# Patient Record
Sex: Female | Born: 1975 | Race: White | Hispanic: No | Marital: Married
Health system: Southern US, Community
[De-identification: ages and names within clinical notes are randomized; demographics above are authoritative.]

---

## 2005-11-18 ENCOUNTER — Ambulatory Visit: Payer: Self-pay | Admitting: Obstetrics & Gynecology

## 2006-02-10 ENCOUNTER — Inpatient Hospital Stay: Payer: Self-pay

## 2008-09-16 ENCOUNTER — Inpatient Hospital Stay: Payer: Self-pay | Admitting: Orthopedic Surgery

## 2008-09-27 ENCOUNTER — Ambulatory Visit: Payer: Self-pay | Admitting: Podiatry

## 2010-01-09 IMAGING — CR DG CHEST 2V
1 series · 2 of 2 positions shown · non-contrast
Comparison: none

REASON FOR EXAM: trauma
COMMENTS:

PROCEDURE:     DXR - DXR CHEST PA (OR AP) AND LATERAL  - September 16, 2008  [DATE]
RESULT:     The lungs are well-expanded and clear. The heart is normal in
size. The pulmonary vascularity is not engorged. There is no pleural
effusion.

[Series 1: view not recorded · 0.17mm/px · 2 of 2 slices shown]
[im 1/2]
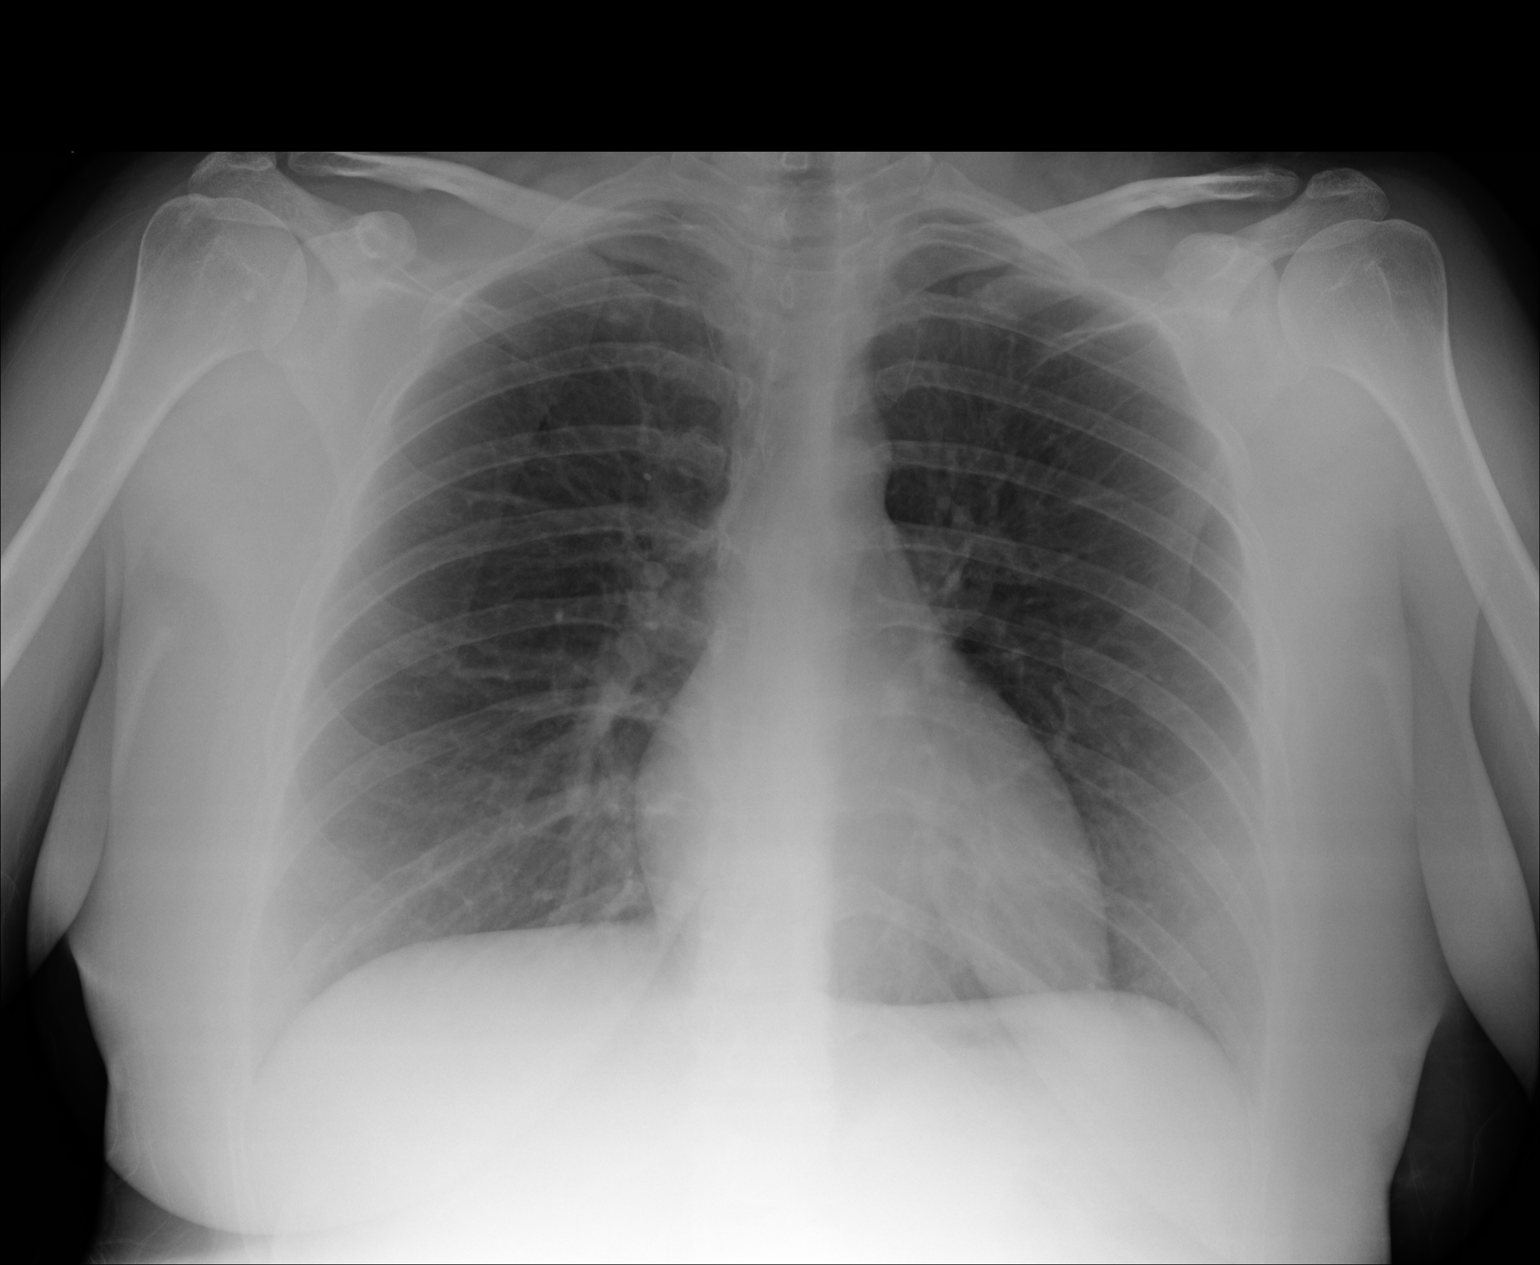
[im 2/2]
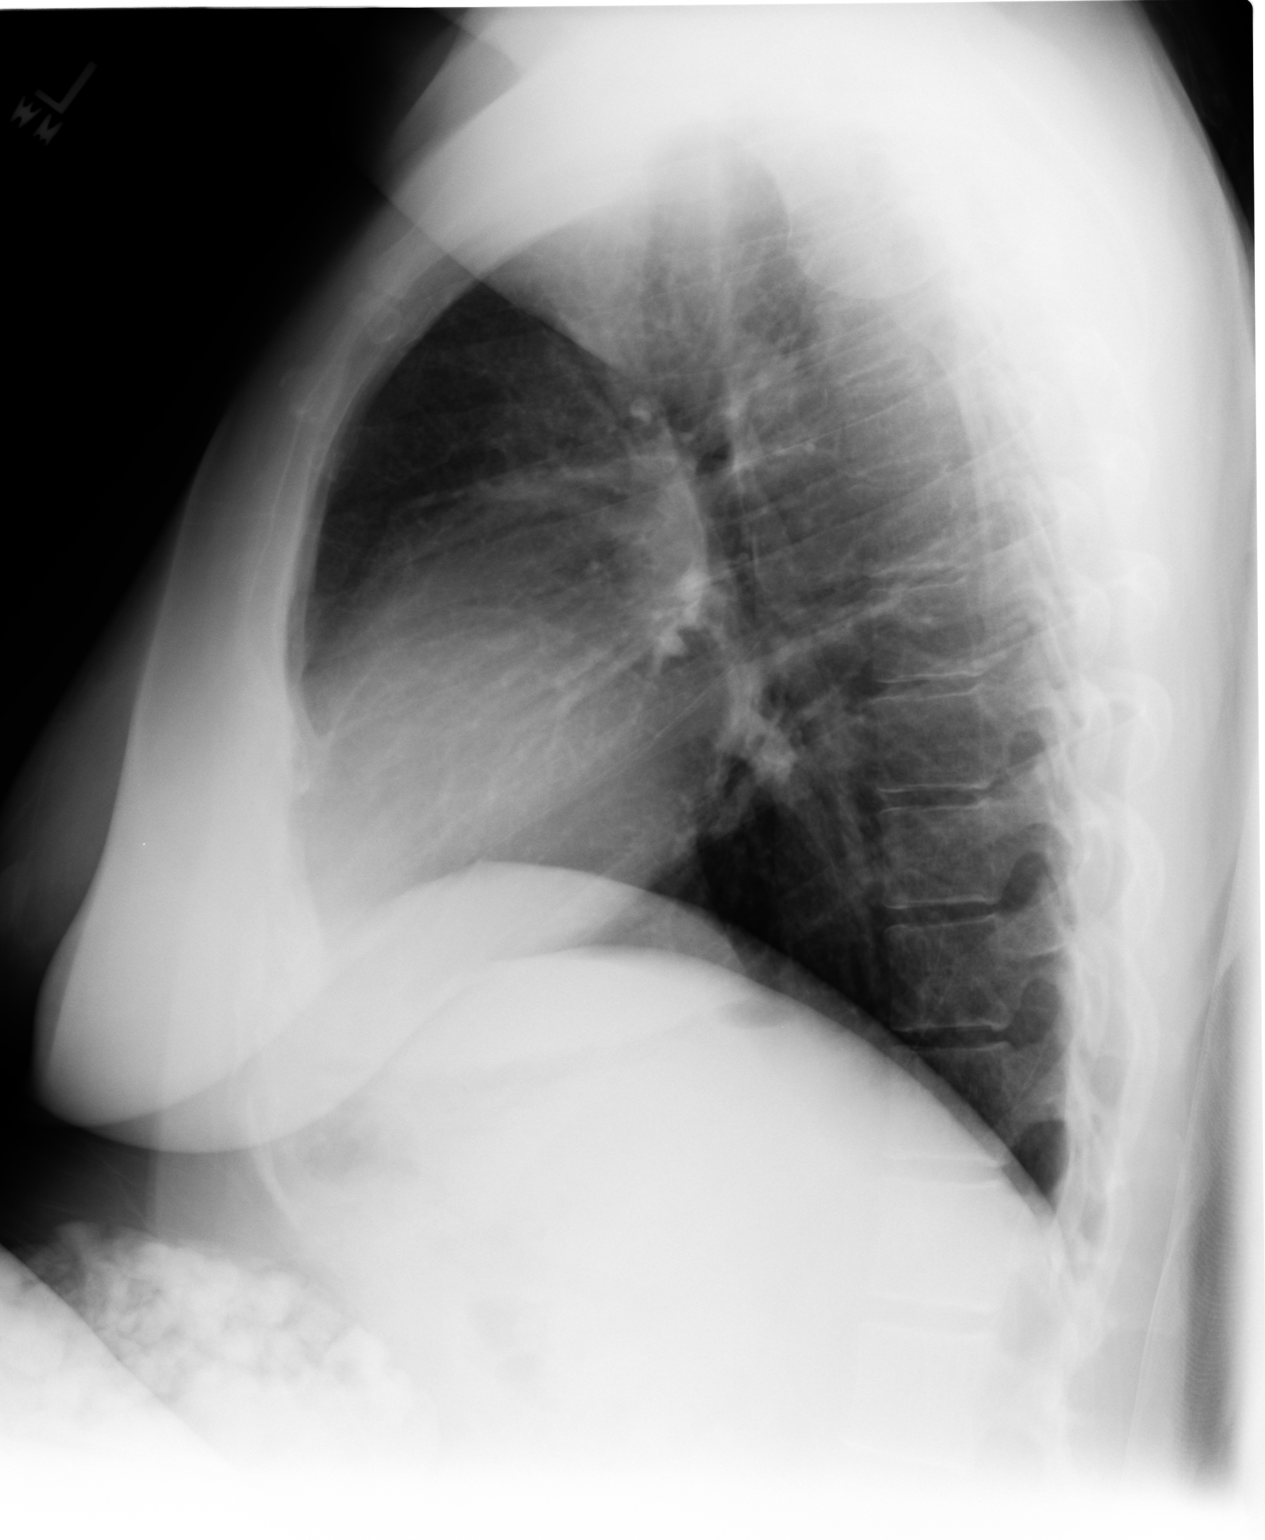

[2 of 2 positions shown; findings below may reference images not displayed]

IMPRESSION: I do not see evidence of a focal infiltrate. The lungs are well-expanded
which is likely voluntary. There are no findings to suggest a pulmonary
contusion. No bony abnormality of the thorax is identified.

## 2010-01-09 IMAGING — CR RIGHT ANKLE - COMPLETE 3+ VIEW
1 series · 5 of 5 positions shown · non-contrast
Comparison: none

REASON FOR EXAM: ecchymosis, swelling and pain
COMMENTS:

[Series 1: view not recorded · 0.17mm/px · 5 of 5 slices shown]
[im 1/5]
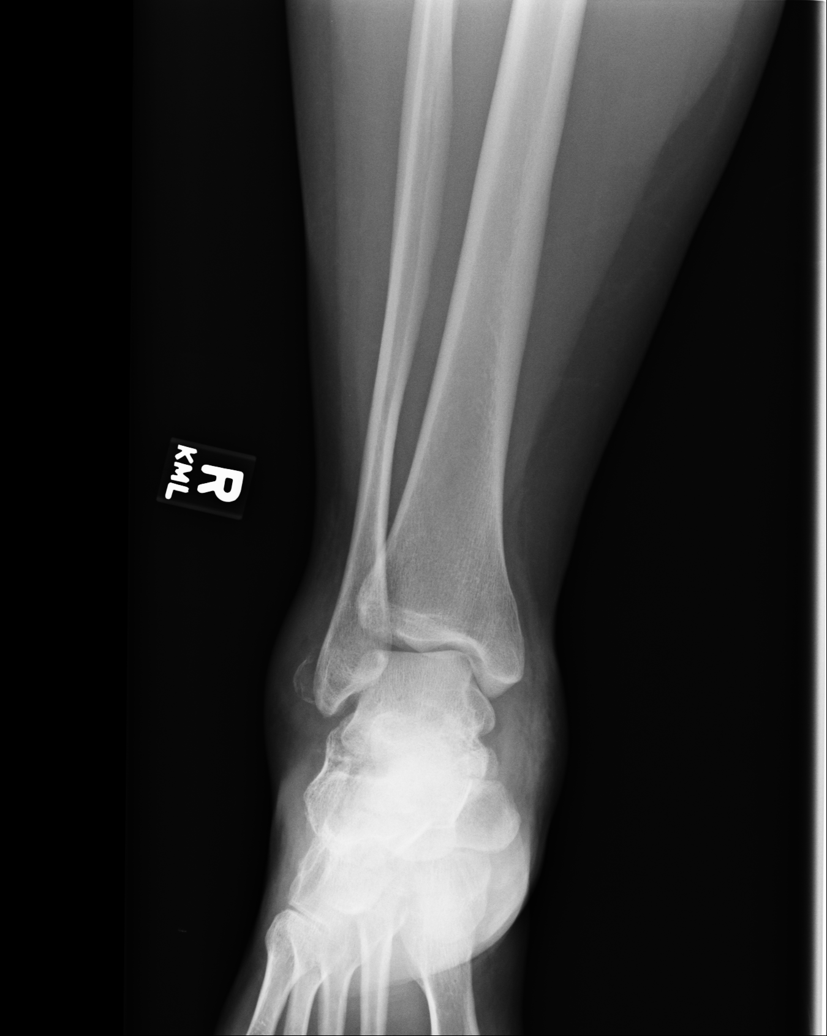
[im 2/5]
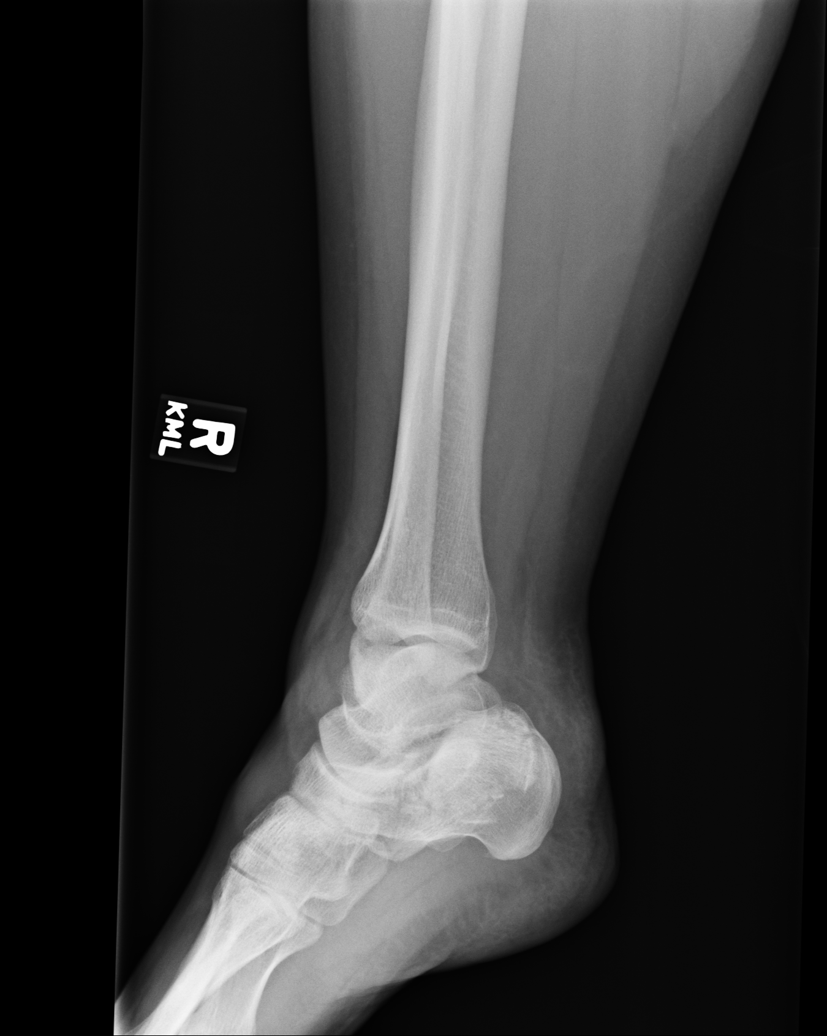
[im 3/5]
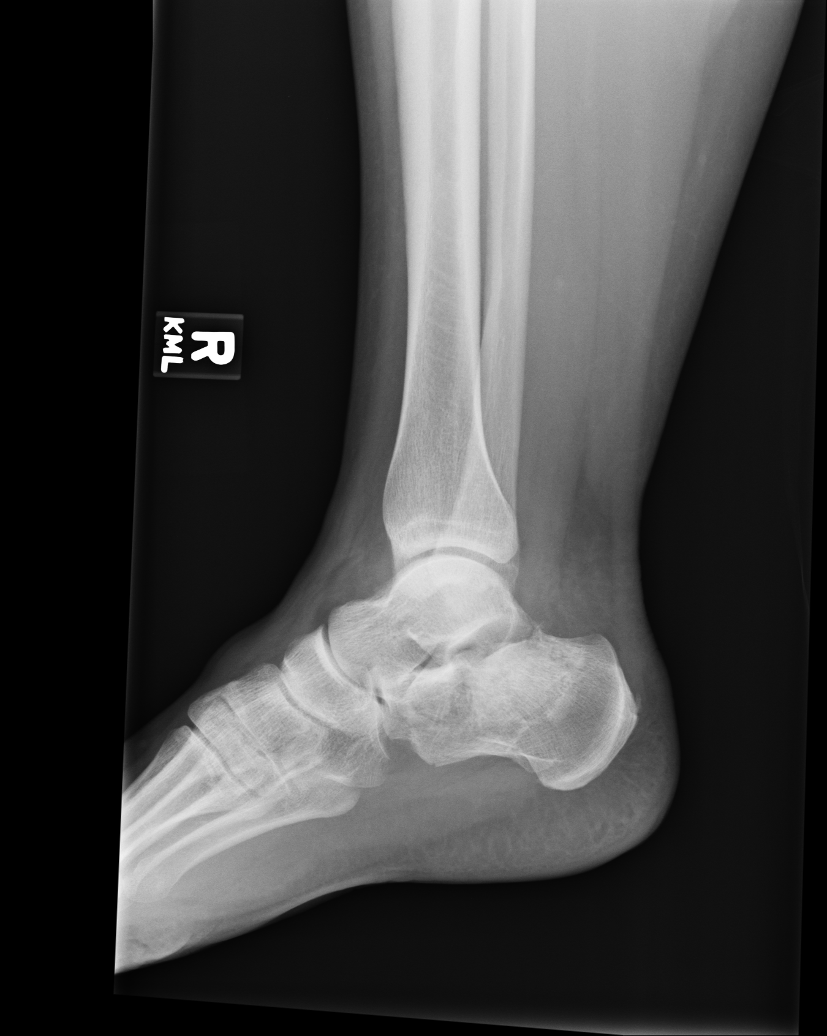
[im 4/5]
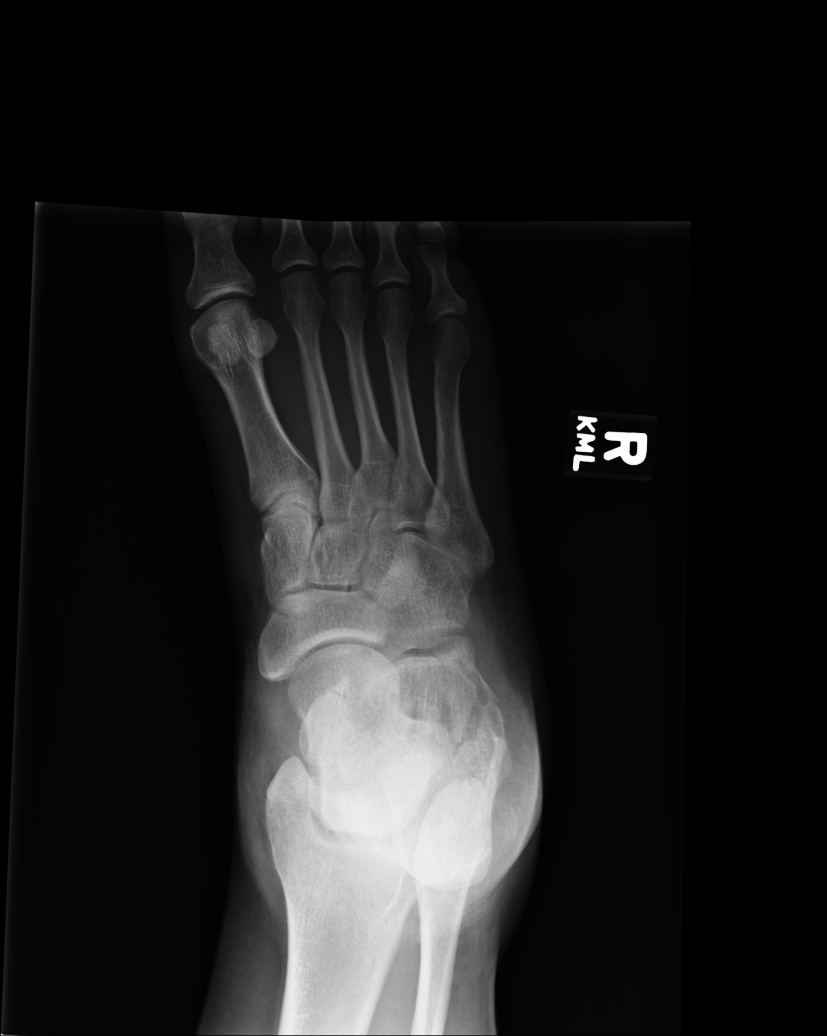
[im 5/5]
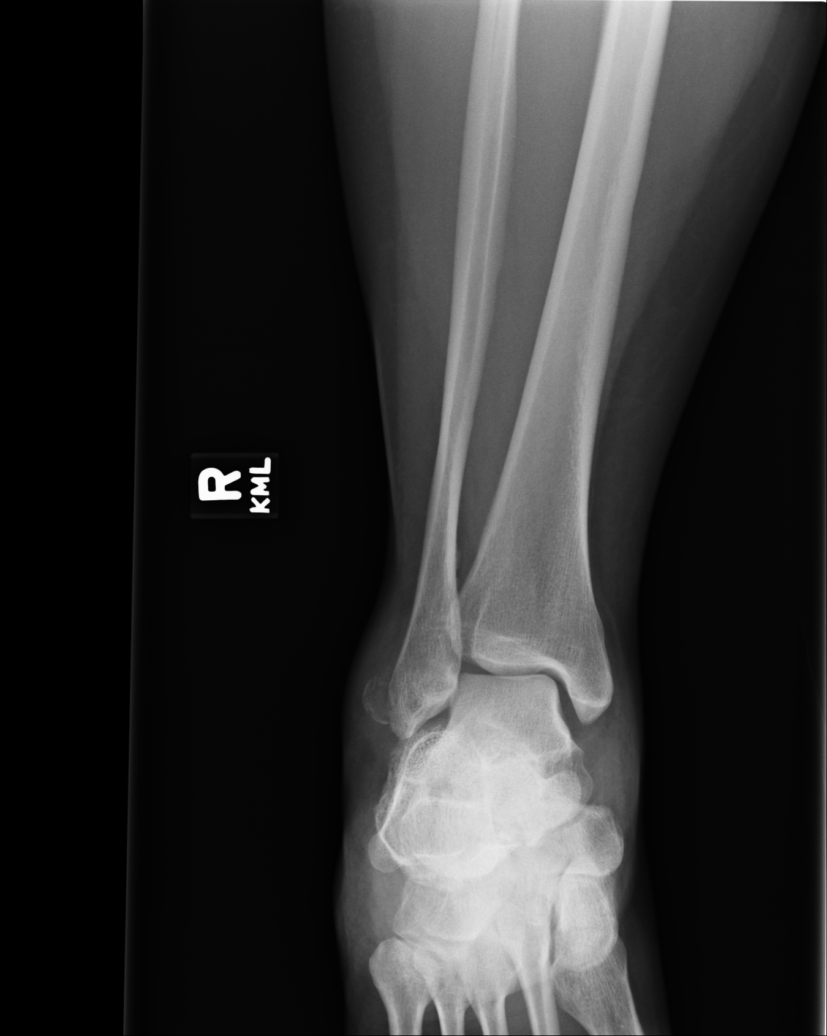

[5 of 5 positions shown; findings below may reference images not displayed]

PROCEDURE:     DXR - DXR ANKLE RIGHT COMPLETE  - September 16, 2008  [DATE]

RESULT:     Five views of the ankle are submitted. There is disruption of
the ankle joint mortise. There is a fracture involving the lateral
malleolus. There is soft tissue swelling laterally. The medial and the
posterior malleoli are intact. There is increased density associated with
the calcaneus that is suspicious for a fracture associated either with the
calcaneus or posterior inferior aspect of the talus.
IMPRESSION: The patient has sustained a fracture involving the lateral malleolus as well
as most likely of the calcaneus. Followup imaging is available upon request.

## 2017-09-11 ENCOUNTER — Other Ambulatory Visit: Payer: Self-pay | Admitting: Certified Nurse Midwife

## 2018-01-17 ENCOUNTER — Other Ambulatory Visit: Payer: Self-pay

## 2018-01-17 MED ORDER — LEVONORGESTREL-ETHINYL ESTRAD 0.1-20 MG-MCG PO TABS: 1.0000 | ORAL_TABLET | Freq: Every day | ORAL | 11 refills | Status: DC

## 2018-01-25 ENCOUNTER — Ambulatory Visit: Payer: Self-pay | Admitting: Certified Nurse Midwife

## 2018-10-14 ENCOUNTER — Other Ambulatory Visit: Payer: Self-pay | Admitting: Certified Nurse Midwife

## 2018-10-23 ENCOUNTER — Telehealth: Payer: Self-pay

## 2018-10-23 NOTE — Telephone Encounter (Signed)
She has not been seen since 2017? Please have the front desk call her to schedule an appointment. Victoria Gillespie. No refills till she is seen

## 2018-10-23 NOTE — Telephone Encounter (Signed)
[facility] calling for medication verification of Aviane for future refills.  [phone] ref# [id number]

## 2018-10-24 NOTE — Telephone Encounter (Signed)
Patient is schedule 12/08/17 with [facility]

## 2018-10-30 ENCOUNTER — Other Ambulatory Visit: Payer: Self-pay | Admitting: Certified Nurse Midwife

## 2018-11-02 ENCOUNTER — Other Ambulatory Visit: Payer: Self-pay | Admitting: Certified Nurse Midwife

## 2018-11-02 MED ORDER — LEVONORGESTREL-ETHINYL ESTRAD 0.1-20 MG-MCG PO TABS: 1.0000 | ORAL_TABLET | Freq: Every day | ORAL | 0 refills | Status: AC

## 2018-12-08 ENCOUNTER — Ambulatory Visit: Payer: Self-pay | Admitting: Certified Nurse Midwife

## 2018-12-19 ENCOUNTER — Other Ambulatory Visit: Payer: Self-pay | Admitting: Certified Nurse Midwife

## 2019-01-04 ENCOUNTER — Other Ambulatory Visit: Payer: Self-pay | Admitting: Certified Nurse Midwife
